# Patient Record
Sex: Female | Born: 1985 | Race: Black or African American | Hispanic: No | Marital: Single | Smoking: Never smoker
Health system: Southern US, Community
[De-identification: ages and names within clinical notes are randomized; demographics above are authoritative.]

---

## 2010-04-10 ENCOUNTER — Encounter: Payer: Self-pay | Admitting: Family Medicine

## 2010-05-04 ENCOUNTER — Encounter: Payer: Self-pay | Admitting: Family Medicine

## 2010-07-11 ENCOUNTER — Observation Stay: Payer: Self-pay | Admitting: Obstetrics and Gynecology

## 2010-07-12 ENCOUNTER — Inpatient Hospital Stay: Payer: Self-pay | Admitting: Obstetrics and Gynecology

## 2011-12-14 ENCOUNTER — Emergency Department: Payer: Self-pay | Admitting: *Deleted

## 2011-12-14 LAB — COMPREHENSIVE METABOLIC PANEL
Alkaline Phosphatase: 47 U/L — ABNORMAL LOW (ref 50–136)
Anion Gap: 8 (ref 7–16)
Bilirubin,Total: 0.7 mg/dL (ref 0.2–1.0)
Co2: 23 mmol/L (ref 21–32)
EGFR (African American): >60
Glucose: 92 mg/dL (ref 65–99)
Osmolality: 277 (ref 275–301)
Potassium: 4 mmol/L (ref 3.5–5.1)
Sodium: 139 mmol/L (ref 136–145)
Total Protein: 8.4 g/dL — ABNORMAL HIGH (ref 6.4–8.2)

## 2011-12-14 LAB — CBC
HGB: 12.9 g/dL (ref 12.0–16.0)
Platelet: 200 10*3/uL (ref 150–440)
WBC: 10.3 10*3/uL (ref 3.6–11.0)

## 2011-12-14 LAB — TROPONIN I: Troponin-I: <0.02 ng/mL

## 2016-09-06 ENCOUNTER — Ambulatory Visit: Payer: Self-pay | Admitting: Physician Assistant

## 2016-09-18 ENCOUNTER — Emergency Department: Payer: 59

## 2016-09-18 ENCOUNTER — Encounter: Payer: Self-pay | Admitting: Emergency Medicine

## 2016-09-18 ENCOUNTER — Emergency Department
Admission: EM | Admit: 2016-09-18 | Discharge: 2016-09-18 | Disposition: A | Discharge status: Home / Self Care | Payer: 59 | Attending: Emergency Medicine | Admitting: Emergency Medicine

## 2016-09-18 DIAGNOSIS — R1011 Right upper quadrant pain: Secondary | ICD-10-CM | POA: Diagnosis not present

## 2016-09-18 DIAGNOSIS — R1110 Vomiting, unspecified: Secondary | ICD-10-CM | POA: Diagnosis not present

## 2016-09-18 DIAGNOSIS — R1013 Epigastric pain: Secondary | ICD-10-CM | POA: Diagnosis present

## 2016-09-18 DIAGNOSIS — R112 Nausea with vomiting, unspecified: Secondary | ICD-10-CM | POA: Diagnosis not present

## 2016-09-18 DIAGNOSIS — R109 Unspecified abdominal pain: Secondary | ICD-10-CM

## 2016-09-18 LAB — COMPREHENSIVE METABOLIC PANEL
ALT: 18 U/L (ref 14–54)
ANION GAP: 9 (ref 5–15)
AST: 27 U/L (ref 15–41)
Albumin: 4.2 g/dL (ref 3.5–5.0)
Alkaline Phosphatase: 41 U/L (ref 38–126)
BUN: 12 mg/dL (ref 6–20)
CO2: 25 mmol/L (ref 22–32)
CREATININE: 0.82 mg/dL (ref 0.44–1.00)
Calcium: 9.3 mg/dL (ref 8.9–10.3)
Chloride: 106 mmol/L (ref 101–111)
Glucose, Bld: 105 mg/dL — ABNORMAL HIGH (ref 65–99)
Potassium: 4 mmol/L (ref 3.5–5.1)
SODIUM: 140 mmol/L (ref 135–145)
TOTAL PROTEIN: 7.7 g/dL (ref 6.5–8.1)
Total Bilirubin: 0.5 mg/dL (ref 0.3–1.2)

## 2016-09-18 LAB — CBC WITH DIFFERENTIAL/PLATELET
BASOS PCT: 0 %
Basophils Absolute: 0 10*3/uL (ref 0–0.1)
EOS PCT: 1 %
Eosinophils Absolute: 0.1 10*3/uL (ref 0–0.7)
HCT: 37.4 % (ref 35.0–47.0)
Hemoglobin: 12.6 g/dL (ref 12.0–16.0)
LYMPHS ABS: 1.7 10*3/uL (ref 1.0–3.6)
Lymphocytes Relative: 13 %
MCH: 30.4 pg (ref 26.0–34.0)
MCHC: 33.8 g/dL (ref 32.0–36.0)
MCV: 90 fL (ref 80.0–100.0)
Monocytes Absolute: 0.9 10*3/uL (ref 0.2–0.9)
Monocytes Relative: 7 %
NEUTROS PCT: 79 %
Neutro Abs: 10.5 10*3/uL — ABNORMAL HIGH (ref 1.4–6.5)
PLATELETS: 233 10*3/uL (ref 150–440)
RBC: 4.16 MIL/uL (ref 3.80–5.20)
RDW: 13.2 % (ref 11.5–14.5)
WBC: 13.1 10*3/uL — ABNORMAL HIGH (ref 3.6–11.0)

## 2016-09-18 LAB — URINALYSIS, COMPLETE (UACMP) WITH MICROSCOPIC
Bilirubin Urine: NEGATIVE
Glucose, UA: NEGATIVE mg/dL
HGB URINE DIPSTICK: NEGATIVE
Ketones, ur: NEGATIVE mg/dL
NITRITE: NEGATIVE
PH: 7 (ref 5.0–8.0)
PROTEIN: NEGATIVE mg/dL
SPECIFIC GRAVITY, URINE: 1.019 (ref 1.005–1.030)

## 2016-09-18 LAB — PREGNANCY, URINE: Preg Test, Ur: NEGATIVE

## 2016-09-18 LAB — LIPASE, BLOOD: LIPASE: 23 U/L (ref 11–51)

## 2016-09-18 MED ORDER — ONDANSETRON HCL 4 MG/2ML IJ SOLN
4.0000 mg | Freq: Once | INTRAMUSCULAR | Status: AC | PRN
  Administered 2016-09-18: 4 mg via INTRAVENOUS
  Filled 2016-09-18: qty 2

## 2016-09-18 MED ORDER — DICYCLOMINE HCL 20 MG PO TABS: 20.0000 mg | ORAL_TABLET | Freq: Three times a day (TID) | ORAL | 0 refills | Status: AC | PRN

## 2016-09-18 MED ORDER — IOPAMIDOL (ISOVUE-300) INJECTION 61%
100.0000 mL | Freq: Once | INTRAVENOUS | Status: AC | PRN
  Administered 2016-09-18: 100 mL via INTRAVENOUS

## 2016-09-18 MED ORDER — IOPAMIDOL (ISOVUE-300) INJECTION 61%
30.0000 mL | Freq: Once | INTRAVENOUS | Status: AC | PRN
  Administered 2016-09-18: 30 mL via ORAL

## 2016-09-18 MED ORDER — ONDANSETRON HCL 4 MG/2ML IJ SOLN
INTRAMUSCULAR | Status: AC
  Filled 2016-09-18: qty 2

## 2016-09-18 MED ORDER — ONDANSETRON HCL 4 MG/2ML IJ SOLN
4.0000 mg | Freq: Once | INTRAMUSCULAR | Status: AC
  Administered 2016-09-18: 4 mg via INTRAVENOUS

## 2016-09-18 MED ORDER — MORPHINE SULFATE (PF) 4 MG/ML IV SOLN
INTRAVENOUS | Status: AC
  Administered 2016-09-18: 4 mg via INTRAVENOUS
  Filled 2016-09-18: qty 1

## 2016-09-18 MED ORDER — MORPHINE SULFATE (PF) 2 MG/ML IV SOLN
2.0000 mg | Freq: Once | INTRAVENOUS | Status: AC
  Administered 2016-09-18: 2 mg via INTRAVENOUS
  Filled 2016-09-18: qty 1

## 2016-09-18 MED ORDER — MORPHINE SULFATE (PF) 4 MG/ML IV SOLN
4.0000 mg | Freq: Once | INTRAVENOUS | Status: AC
  Administered 2016-09-18: 4 mg via INTRAVENOUS

## 2016-09-18 MED ORDER — CEPHALEXIN 500 MG PO CAPS: 500.0000 mg | ORAL_CAPSULE | Freq: Two times a day (BID) | ORAL | 0 refills | Status: AC

## 2016-09-18 MED ORDER — ONDANSETRON HCL 4 MG PO TABS: 4.0000 mg | ORAL_TABLET | Freq: Three times a day (TID) | ORAL | 0 refills | Status: AC | PRN

## 2016-09-18 NOTE — Discharge Instructions (Signed)
Please return immediately if condition worsens. Please contact her primary physician or the physician you were given for referral. If you have any specialist physicians involved in her treatment and plan please also contact them. Thank you for using [facility]. ° °

## 2016-09-18 NOTE — ED Notes (Signed)
Pt taken to Ct via stretcher

## 2016-09-18 NOTE — ED Provider Notes (Signed)
----------------------------------------- 9:31 AM on 09/18/2016 -----------------------------------------   Blood pressure 121/71, pulse 88, temperature 98.3 F (36.8 C), temperature source Oral, resp. rate 20, height 5\' 11"  (1.803 m), weight 268 lb (121.6 kg), last menstrual period 09/16/2016, SpO2 99 %.  Assuming care from Dr. Manson Passey.  In short, Peggy Fritz is a 31 y.o. female with a chief complaint of Abdominal Pain .  Refer to the original H&P for additional details.  The current plan of care is to *discharge the patient home.  "Ct Abdomen Pelvis W Contrast  Result Date: 09/18/2016 CLINICAL DATA:  Right upper quadrant pain with nausea and vomiting for 1 day. EXAM: CT ABDOMEN AND PELVIS WITH CONTRAST TECHNIQUE: Multidetector CT imaging of the abdomen and pelvis was performed using the standard protocol following bolus administration of intravenous contrast. CONTRAST:  100 ml ISOVUE-300 IOPAMIDOL (ISOVUE-300) INJECTION 61% COMPARISON:  None. FINDINGS: Lower chest: Lung bases are clear. No pleural or pericardial effusion. Hepatobiliary: No focal liver abnormality is seen. No gallstones, gallbladder wall thickening, or biliary dilatation. Pancreas: Unremarkable. No pancreatic ductal dilatation or surrounding inflammatory changes. Spleen: Normal in size without focal abnormality. Adrenals/Urinary Tract: Adrenal glands are unremarkable. Kidneys are normal, without renal calculi, focal lesion, or hydronephrosis. Bladder is unremarkable. Stomach/Bowel: Stomach is within normal limits. Appendix appears normal. No evidence of bowel wall thickening, distention, or inflammatory changes. Prominent stool burden ascending and transverse colon noted. Vascular/Lymphatic: No significant vascular findings are present. No enlarged abdominal or pelvic lymph nodes. Reproductive: Uterus and bilateral adnexa are unremarkable. IUD is in place. Other: No abdominal wall hernia or abnormality. No abdominopelvic ascites.  Musculoskeletal: No acute or significant osseous findings. IMPRESSION: Negative exam.  No finding to explain the patient's symptoms. Electronically Signed   By: Drusilla Kanner M.D.   On: 09/18/2016 08:52   US Abdomen Limited Ruq  Result Date: 09/18/2016 CLINICAL DATA:  Right upper quadrant pain and nausea. EXAM: US ABDOMEN LIMITED - RIGHT UPPER QUADRANT COMPARISON:  None. FINDINGS: Gallbladder: Physiologically distended. No gallstones or wall thickening visualized. No sonographic Murphy sign noted by sonographer, patient received pain medication prior to the exam. Common bile duct: Diameter: 2 mm. Liver: No focal lesion identified. Mildly patchy parenchymal echogenicity. Normal directional flow in the main portal vein. IMPRESSION: 1. Normal sonographic appearance of the gallbladder and biliary tree. 2. Borderline hepatic steatosis. Electronically Signed   By: Rubye Oaks M.D.   On: 09/18/2016 07:04  " Review of laboratory work shows some findings indicative of a urinary tract infection though the patient really doesn't have strong urinary complaints. I felt she would benefit with some oral antibiotics and also culture her urine. May also have acute viral gastroenteritis due to the frequency in the area. It appears she doesn't have any obvious surgical findings at this time.  " New Prescriptions   CEPHALEXIN (KEFLEX) 500 MG CAPSULE    Take 1 capsule (500 mg total) by mouth 2 (two) times daily.   DICYCLOMINE (BENTYL) 20 MG TABLET    Take 1 tablet (20 mg total) by mouth 3 (three) times daily as needed for spasms.   ONDANSETRON (ZOFRAN) 4 MG TABLET    Take 1 tablet (4 mg total) by mouth every 8 (eight) hours as needed for nausea or vomiting.  " Patient was advised to return immediately if condition worsens. Patient was advised to follow up with their primary care physician or other specialized physicians involved in their outpatient care. The patient and/or family member/power of attorney had  laboratory results  reviewed at the bedside. All questions and concerns were addressed and appropriate discharge instructions were distributed by the nursing staff.    Clinical Course       Jennye MoccasinBrian S Quigley, MD 09/18/16 [phone]

## 2016-09-18 NOTE — ED Provider Notes (Signed)
[facility] Emergency Department Provider Note   First MD Initiated Contact with Patient 09/18/16 [phone]     (approximate)  I have reviewed the triage vital signs and the nursing notes.   HISTORY  Chief Complaint Abdominal Pain    HPI Peggy Fritz is a 31 y.o. female presents to the emergency department acute onset of epigastric abdominal pain radiating to her back associated with nausea this morning. Patient denies any urinary symptoms no vomiting or diarrhea. Patient states her current pain score is 9 out of 10. Patient denies any fever or febrile on presentation temperature 98.3   Past medical history None There are no active problems to display for this patient.   Past surgical history None  Prior to Admission medications   Not on File    Allergies Patient has no known allergies.  No family history on file.  Social History Social History  Substance Use Topics  . Smoking status: Never Smoker  . Smokeless tobacco: Never Used  . Alcohol use No    Review of Systems Constitutional: No fever/chills Eyes: No visual changes. ENT: No sore throat. Cardiovascular: Denies chest pain. Respiratory: Denies shortness of breath. Gastrointestinal: Positive for abdominal pain No nausea, no vomiting.  No diarrhea.  No constipation. Genitourinary: Negative for dysuria. Musculoskeletal: Negative for back pain. Skin: Negative for rash. Neurological: Negative for headaches, focal weakness or numbness.  10-point ROS otherwise negative.  ____________________________________________   PHYSICAL EXAM:  VITAL SIGNS: ED Triage Vitals  Enc Vitals Group     BP 09/18/16 0440 128/80     Pulse Rate 09/18/16 0440 (!) 107     Resp 09/18/16 0440 20     Temp 09/18/16 0440 98.3 F (36.8 C)     Temp Source 09/18/16 0440 Oral     SpO2 09/18/16 0440 100 %     Weight 09/18/16 0441 268 lb (121.6 kg)     Height 09/18/16 0441 5\' 11"  (1.803 m)     Head  Circumference --      Peak Flow --      Pain Score 09/18/16 0444 10     Pain Loc --      Pain Edu? --      Excl. in GC? --     Constitutional: Alert and oriented.Apparent discomfort  Eyes: Conjunctivae are normal. PERRL. EOMI. Head: Atraumatic. Mouth/Throat: Mucous membranes are moist.  Oropharynx non-erythematous. Neck: No stridor.  Cardiovascular: Normal rate, regular rhythm. Good peripheral circulation. Grossly normal heart sounds. Respiratory: Normal respiratory effort.  No retractions. Lungs CTAB. Gastrointestinal: Right upper quadrant/epigastric tenderness to palpation No distention.  Musculoskeletal: No lower extremity tenderness nor edema. No gross deformities of extremities. Neurologic:  Normal speech and language. No gross focal neurologic deficits are appreciated.  Skin:  Skin is warm, dry and intact. No rash noted. Psychiatric: Mood and affect are normal. Speech and behavior are normal.  ____________________________________________   LABS (all labs ordered are listed, but only abnormal results are displayed)  Labs Reviewed  CBC WITH DIFFERENTIAL/PLATELET - Abnormal; Notable for the following:       Result Value   WBC 13.1 (*)    Neutro Abs 10.5 (*)    All other components within normal limits  COMPREHENSIVE METABOLIC PANEL - Abnormal; Notable for the following:    Glucose, Bld 105 (*)    All other components within normal limits  URINALYSIS, COMPLETE (UACMP) WITH MICROSCOPIC - Abnormal; Notable for the following:    Color, Urine YELLOW (*)  APPearance CLEAR (*)    Leukocytes, UA SMALL (*)    Bacteria, UA RARE (*)    Squamous Epithelial / LPF 0-5 (*)    All other components within normal limits  LIPASE, BLOOD  PREGNANCY, URINE     RADIOLOGY I,  N BROWN, personally viewed and evaluated these images (plain radiographs) as part of my medical decision making, as well as reviewing the written report by the radiologist.  US Abdomen Limited  Ruq  Result Date: 09/18/2016 CLINICAL DATA:  Right upper quadrant pain and nausea. EXAM: US ABDOMEN LIMITED - RIGHT UPPER QUADRANT COMPARISON:  None. FINDINGS: Gallbladder: Physiologically distended. No gallstones or wall thickening visualized. No sonographic Murphy sign noted by sonographer, patient received pain medication prior to the exam. Common bile duct: Diameter: 2 mm. Liver: No focal lesion identified. Mildly patchy parenchymal echogenicity. Normal directional flow in the main portal vein. IMPRESSION: 1. Normal sonographic appearance of the gallbladder and biliary tree. 2. Borderline hepatic steatosis. Electronically Signed   By: Rubye Oaks M.D.   On: 09/18/2016 07:04     Procedures     INITIAL IMPRESSION / ASSESSMENT AND PLAN / ED COURSE  Pertinent labs & imaging results that were available during my care of the patient were reviewed by me and considered in my medical decision making (see chart for details).  Patient given 4 mg IV morphine with improvement of discomfort. Ultrasound revealed no evidence of gallbladder disease. We'll obtain CT scan of the abdomen and pelvis further evaluation. Patient's care transferred to Dr. Huel Cote   Clinical Course     ____________________________________________  FINAL CLINICAL IMPRESSION(S) / ED DIAGNOSES  Final diagnoses:  RUQ pain     MEDICATIONS GIVEN DURING THIS VISIT:  Medications  ondansetron (ZOFRAN) injection 4 mg (not administered)  ondansetron (ZOFRAN) 4 MG/2ML injection (not administered)  ondansetron (ZOFRAN) injection 4 mg (4 mg Intravenous Given 09/18/16 0516)  morphine 4 MG/ML injection 4 mg (4 mg Intravenous Given 09/18/16 0516)  iopamidol (ISOVUE-300) 61 % injection 30 mL (30 mLs Oral Contrast Given 09/18/16 0743)     NEW OUTPATIENT MEDICATIONS STARTED DURING THIS VISIT:  New Prescriptions   No medications on file    Modified Medications   No medications on file    Discontinued Medications   No  medications on file     Note:  This document was prepared using Dragon voice recognition software and may include unintentional dictation errors.    Darci Current, MD 09/19/16 [phone]

## 2016-09-18 NOTE — ED Triage Notes (Signed)
Pt ambulatory to triage with steady gait with c/o sudden onset of epigastric pain radiating to back accompanied by nausea and odor to urine. Pt denies urinary frequency, dysuria, vomiting, or diarrhea. Pt tearful in triage. Pt reports hx of similar sx, reports was not checked by doctor.

## 2016-09-20 LAB — URINE CULTURE

## 2017-01-10 DIAGNOSIS — H52223 Regular astigmatism, bilateral: Secondary | ICD-10-CM | POA: Diagnosis not present

## 2017-07-03 DIAGNOSIS — M791 Myalgia, unspecified site: Secondary | ICD-10-CM | POA: Diagnosis not present

## 2017-07-03 DIAGNOSIS — M5430 Sciatica, unspecified side: Secondary | ICD-10-CM | POA: Diagnosis not present

## 2017-07-03 DIAGNOSIS — M9902 Segmental and somatic dysfunction of thoracic region: Secondary | ICD-10-CM | POA: Diagnosis not present

## 2017-07-03 DIAGNOSIS — R51 Headache: Secondary | ICD-10-CM | POA: Diagnosis not present

## 2017-07-03 DIAGNOSIS — M9905 Segmental and somatic dysfunction of pelvic region: Secondary | ICD-10-CM | POA: Diagnosis not present

## 2017-07-03 DIAGNOSIS — Z72820 Sleep deprivation: Secondary | ICD-10-CM | POA: Diagnosis not present

## 2017-07-03 DIAGNOSIS — M6240 Contracture of muscle, unspecified site: Secondary | ICD-10-CM | POA: Diagnosis not present

## 2017-07-03 DIAGNOSIS — M9903 Segmental and somatic dysfunction of lumbar region: Secondary | ICD-10-CM | POA: Diagnosis not present

## 2017-07-03 DIAGNOSIS — M9901 Segmental and somatic dysfunction of cervical region: Secondary | ICD-10-CM | POA: Diagnosis not present

## 2017-07-05 DIAGNOSIS — M9905 Segmental and somatic dysfunction of pelvic region: Secondary | ICD-10-CM | POA: Diagnosis not present

## 2017-07-05 DIAGNOSIS — M9903 Segmental and somatic dysfunction of lumbar region: Secondary | ICD-10-CM | POA: Diagnosis not present

## 2017-07-05 DIAGNOSIS — M9901 Segmental and somatic dysfunction of cervical region: Secondary | ICD-10-CM | POA: Diagnosis not present

## 2017-07-05 DIAGNOSIS — M5430 Sciatica, unspecified side: Secondary | ICD-10-CM | POA: Diagnosis not present

## 2017-07-05 DIAGNOSIS — M6240 Contracture of muscle, unspecified site: Secondary | ICD-10-CM | POA: Diagnosis not present

## 2017-07-05 DIAGNOSIS — R51 Headache: Secondary | ICD-10-CM | POA: Diagnosis not present

## 2017-07-05 DIAGNOSIS — M9902 Segmental and somatic dysfunction of thoracic region: Secondary | ICD-10-CM | POA: Diagnosis not present

## 2017-07-05 DIAGNOSIS — M791 Myalgia, unspecified site: Secondary | ICD-10-CM | POA: Diagnosis not present

## 2017-07-05 DIAGNOSIS — Z72820 Sleep deprivation: Secondary | ICD-10-CM | POA: Diagnosis not present

## 2017-07-08 DIAGNOSIS — R51 Headache: Secondary | ICD-10-CM | POA: Diagnosis not present

## 2017-07-08 DIAGNOSIS — M5430 Sciatica, unspecified side: Secondary | ICD-10-CM | POA: Diagnosis not present

## 2017-07-08 DIAGNOSIS — M9902 Segmental and somatic dysfunction of thoracic region: Secondary | ICD-10-CM | POA: Diagnosis not present

## 2017-07-08 DIAGNOSIS — Z72820 Sleep deprivation: Secondary | ICD-10-CM | POA: Diagnosis not present

## 2017-07-08 DIAGNOSIS — M6240 Contracture of muscle, unspecified site: Secondary | ICD-10-CM | POA: Diagnosis not present

## 2017-07-08 DIAGNOSIS — M9905 Segmental and somatic dysfunction of pelvic region: Secondary | ICD-10-CM | POA: Diagnosis not present

## 2017-07-08 DIAGNOSIS — M9903 Segmental and somatic dysfunction of lumbar region: Secondary | ICD-10-CM | POA: Diagnosis not present

## 2017-07-08 DIAGNOSIS — M791 Myalgia, unspecified site: Secondary | ICD-10-CM | POA: Diagnosis not present

## 2017-07-08 DIAGNOSIS — M9901 Segmental and somatic dysfunction of cervical region: Secondary | ICD-10-CM | POA: Diagnosis not present

## 2017-08-16 DIAGNOSIS — M9901 Segmental and somatic dysfunction of cervical region: Secondary | ICD-10-CM | POA: Diagnosis not present

## 2017-08-16 DIAGNOSIS — M9902 Segmental and somatic dysfunction of thoracic region: Secondary | ICD-10-CM | POA: Diagnosis not present

## 2017-08-16 DIAGNOSIS — Z72820 Sleep deprivation: Secondary | ICD-10-CM | POA: Diagnosis not present

## 2017-08-16 DIAGNOSIS — M5430 Sciatica, unspecified side: Secondary | ICD-10-CM | POA: Diagnosis not present

## 2017-08-16 DIAGNOSIS — M791 Myalgia, unspecified site: Secondary | ICD-10-CM | POA: Diagnosis not present

## 2017-08-16 DIAGNOSIS — M6240 Contracture of muscle, unspecified site: Secondary | ICD-10-CM | POA: Diagnosis not present

## 2017-08-16 DIAGNOSIS — M9903 Segmental and somatic dysfunction of lumbar region: Secondary | ICD-10-CM | POA: Diagnosis not present

## 2017-08-16 DIAGNOSIS — M9905 Segmental and somatic dysfunction of pelvic region: Secondary | ICD-10-CM | POA: Diagnosis not present

## 2017-08-16 DIAGNOSIS — R51 Headache: Secondary | ICD-10-CM | POA: Diagnosis not present

## 2017-11-02 IMAGING — CT CT ABD-PELV W/ CM
2 of 7 series · 14 of 46 positions shown, 18 images · IV contrast (APPLIED)
Comparison: None.

CLINICAL DATA: Right upper quadrant pain with nausea and vomiting
for 1 day.

EXAM:
CT ABDOMEN AND PELVIS WITH CONTRAST
TECHNIQUE: Multidetector CT imaging of the abdomen and pelvis was performed
using the standard protocol following bolus administration of
intravenous contrast.
CONTRAST:  100 ml [id number] IOPAMIDOL ([id number]-GXX) INJECTION 61%

[Series 2: axial st · axial · 0.71mm/px · z∈[-891,-511]mm · 11 of 91 slices shown, 15 images]
[im 10/91  soft-tissue]
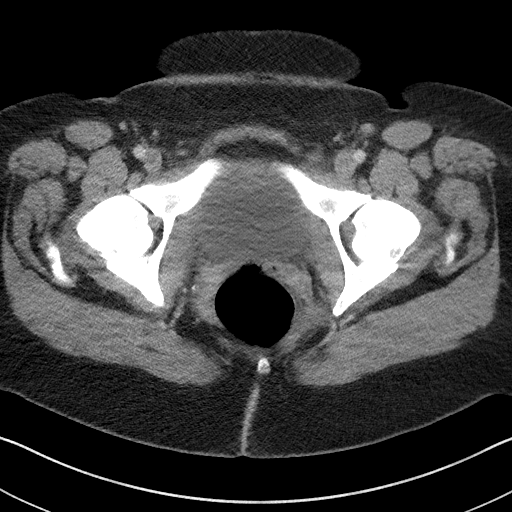
[im 10/91  bone]
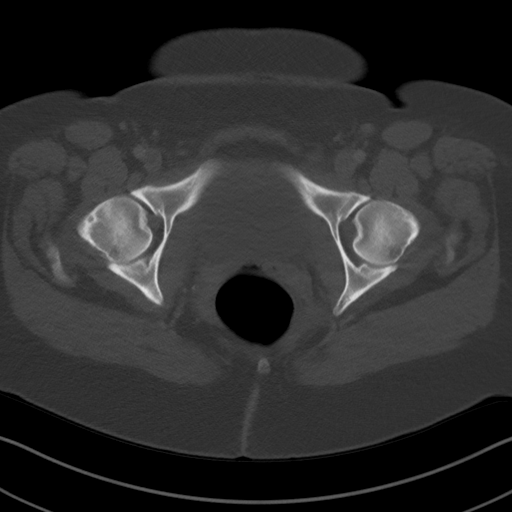
[im 19/91  soft-tissue]
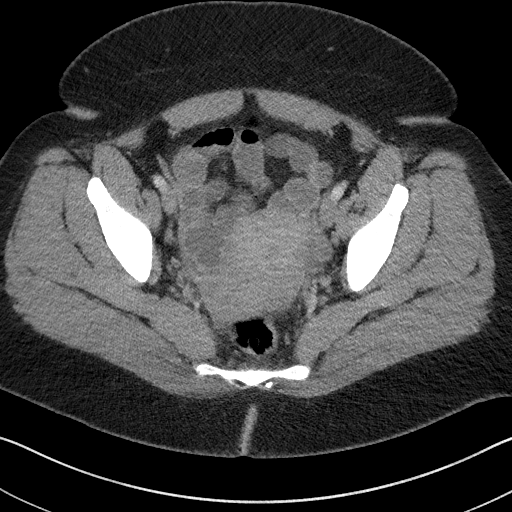
[im 28/91  soft-tissue]
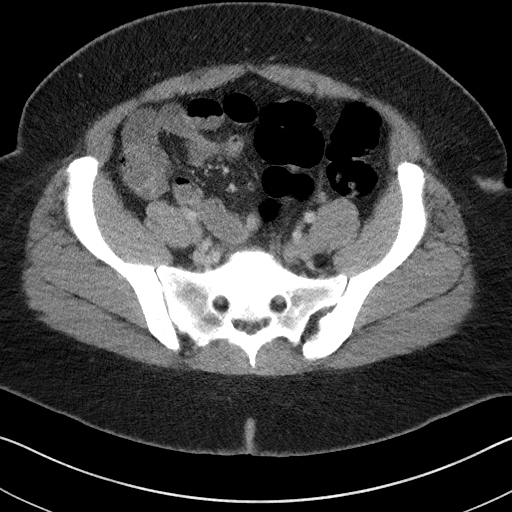
[im 37/91  soft-tissue]
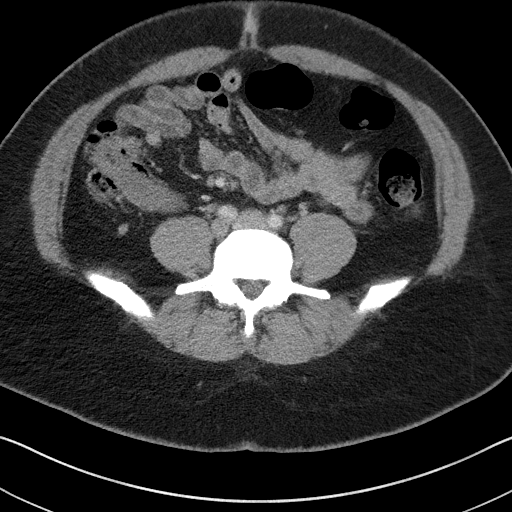
[im 46/91  soft-tissue]
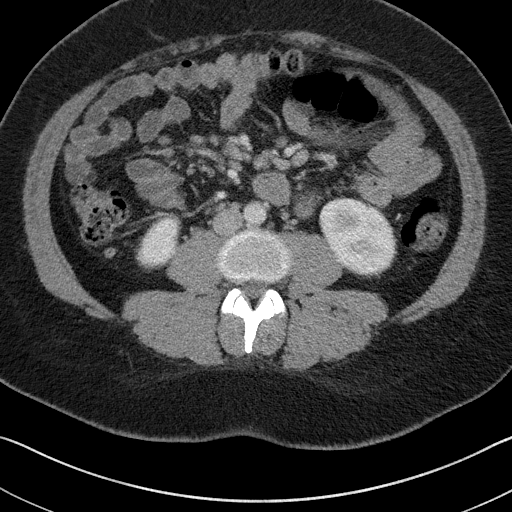
[im 55/91  soft-tissue]
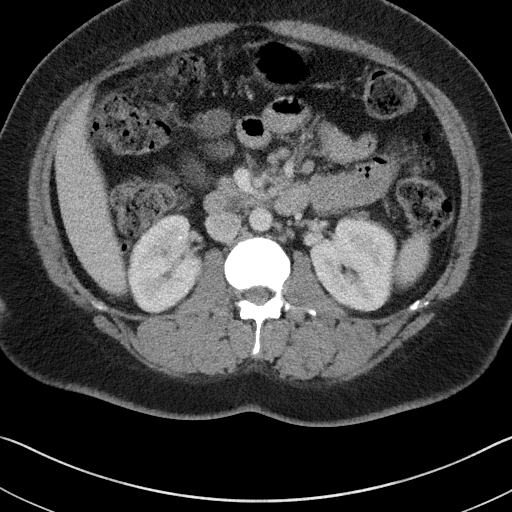
[im 64/91  soft-tissue]
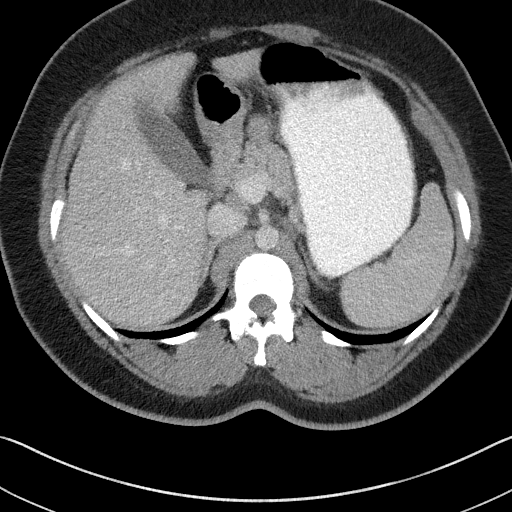
[im 73/91  soft-tissue]
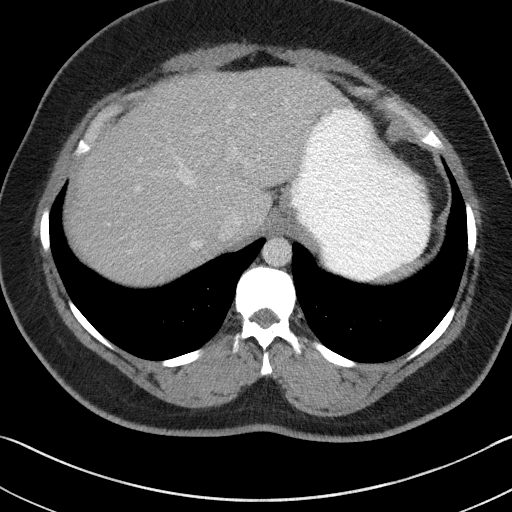
[im 73/91  lung]
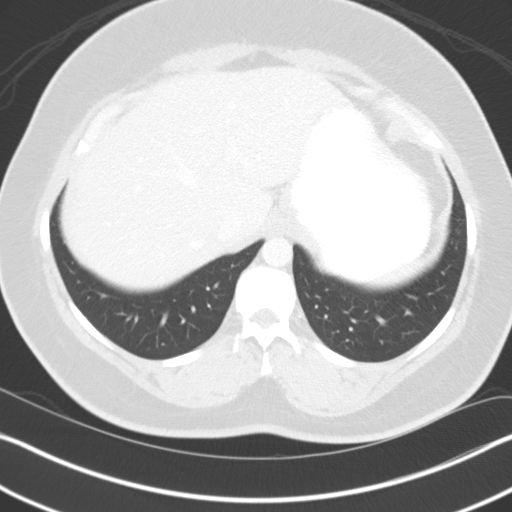
[im 77/91  lung]
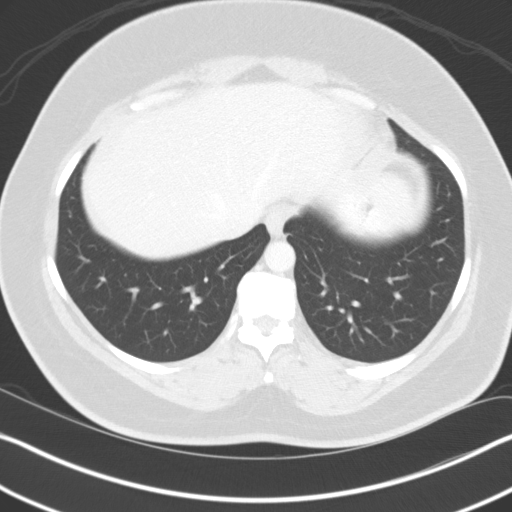
[im 82/91  soft-tissue]
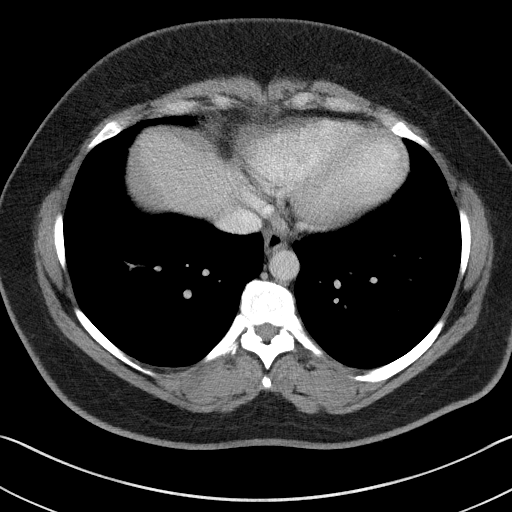
[im 82/91  lung]
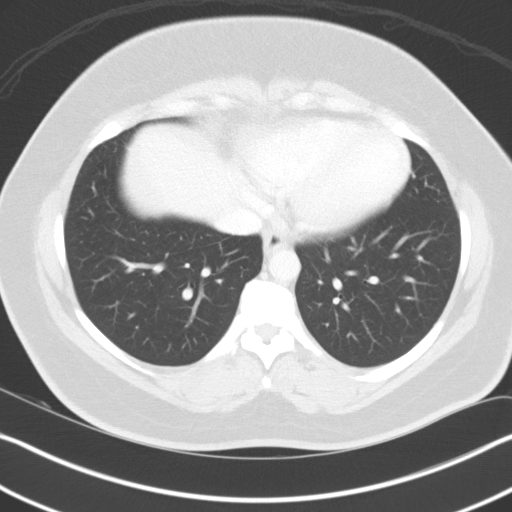
[im 82/91  bone]
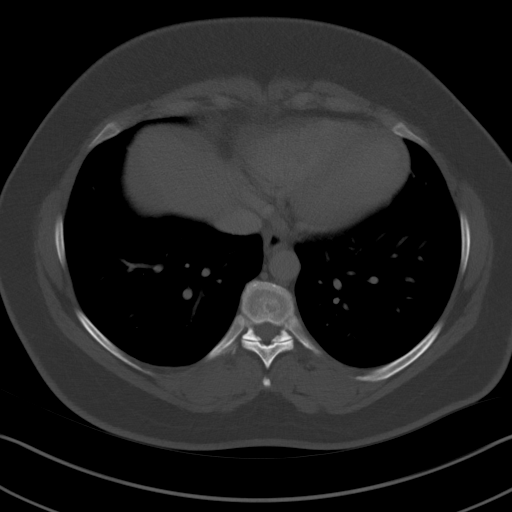
[im 86/91  lung]
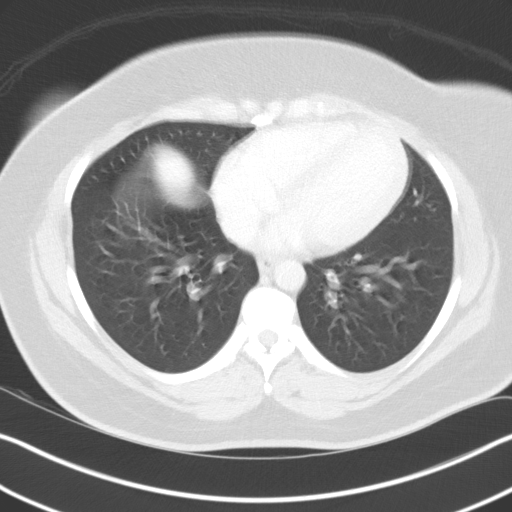

[Series 7: coronal st · coronal · 0.75mm/px · 3 of 98 slices shown]
[im 20/98  soft-tissue]
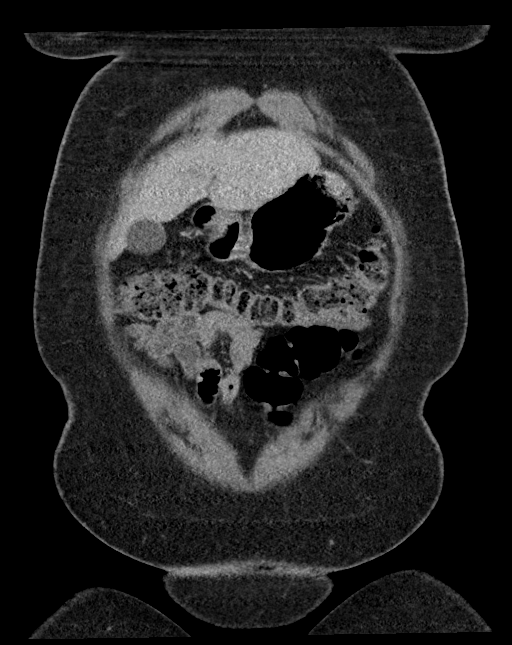
[im 39/98  soft-tissue]
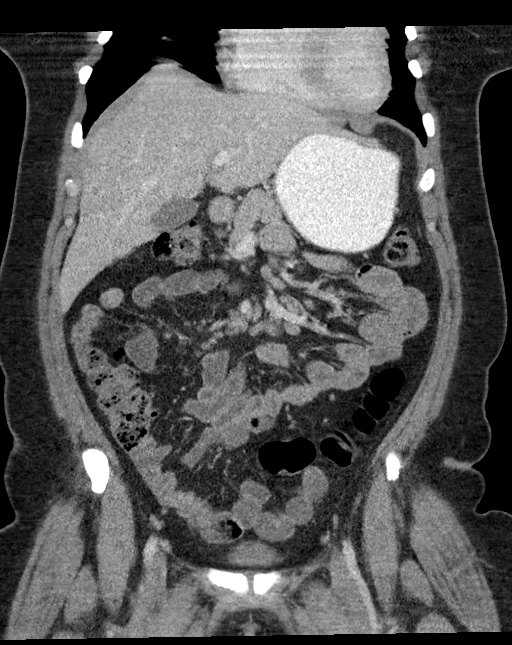
[im 59/98  soft-tissue]
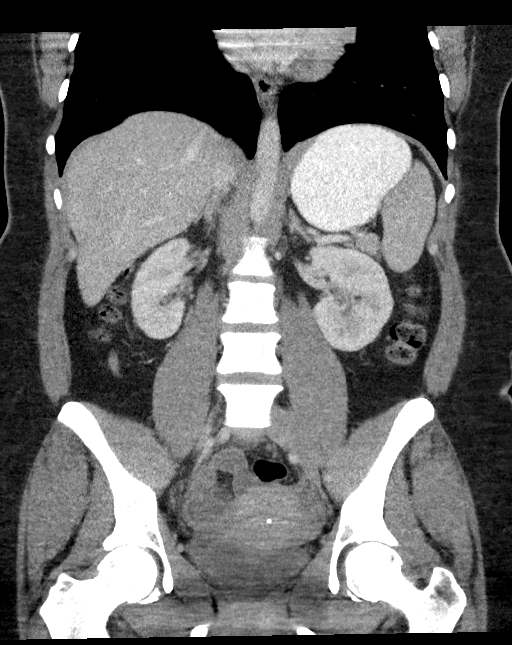

[14 of 46 positions shown; findings below may reference images not displayed]

FINDINGS: Lower chest: Lung bases are clear. No pleural or pericardial
effusion.

Hepatobiliary: No focal liver abnormality is seen. No gallstones,
gallbladder wall thickening, or biliary dilatation.

Pancreas: Unremarkable. No pancreatic ductal dilatation or
surrounding inflammatory changes.

Spleen: Normal in size without focal abnormality.

Adrenals/Urinary Tract: Adrenal glands are unremarkable. Kidneys are
normal, without renal calculi, focal lesion, or hydronephrosis.
Bladder is unremarkable.

Stomach/Bowel: Stomach is within normal limits. Appendix appears
normal. No evidence of bowel wall thickening, distention, or
inflammatory changes. Prominent stool burden ascending and
transverse colon noted.

Vascular/Lymphatic: No significant vascular findings are present. No
enlarged abdominal or pelvic lymph nodes.

Reproductive: Uterus and bilateral adnexa are unremarkable. IUD is
in place.

Other: No abdominal wall hernia or abnormality. No abdominopelvic
ascites.

Musculoskeletal: No acute or significant osseous findings.
IMPRESSION: Negative exam.  No finding to explain the patient's symptoms.
